# Patient Record
Sex: Male | Born: 1980 | Race: Black or African American | Hispanic: No | Marital: Single
Health system: Southern US, Community
[De-identification: ages and names within clinical notes are randomized; demographics above are authoritative.]

---

## 2021-01-17 ENCOUNTER — Encounter (HOSPITAL_COMMUNITY): Payer: Self-pay | Admitting: Pharmacy Technician

## 2021-01-17 ENCOUNTER — Other Ambulatory Visit: Payer: Self-pay

## 2021-01-17 ENCOUNTER — Emergency Department (HOSPITAL_COMMUNITY)
Admission: EM | Admit: 2021-01-17 | Discharge: 2021-01-17 | Disposition: A | Discharge status: Home / Self Care | Payer: Self-pay | Attending: Emergency Medicine | Admitting: Emergency Medicine

## 2021-01-17 ENCOUNTER — Emergency Department (HOSPITAL_COMMUNITY): Payer: Self-pay

## 2021-01-17 DIAGNOSIS — R61 Generalized hyperhidrosis: Secondary | ICD-10-CM | POA: Insufficient documentation

## 2021-01-17 DIAGNOSIS — Z5321 Procedure and treatment not carried out due to patient leaving prior to being seen by health care provider: Secondary | ICD-10-CM | POA: Insufficient documentation

## 2021-01-17 DIAGNOSIS — R110 Nausea: Secondary | ICD-10-CM | POA: Insufficient documentation

## 2021-01-17 DIAGNOSIS — R072 Precordial pain: Secondary | ICD-10-CM | POA: Insufficient documentation

## 2021-01-17 DIAGNOSIS — R0602 Shortness of breath: Secondary | ICD-10-CM | POA: Insufficient documentation

## 2021-01-17 LAB — CBC
HCT: 42.9 % (ref 39.0–52.0)
Hemoglobin: 14.1 g/dL (ref 13.0–17.0)
MCH: 32 pg (ref 26.0–34.0)
MCHC: 32.9 g/dL (ref 30.0–36.0)
MCV: 97.3 fL (ref 80.0–100.0)
Platelets: 258 10*3/uL (ref 150–400)
RBC: 4.41 MIL/uL (ref 4.22–5.81)
RDW: 13.3 % (ref 11.5–15.5)
WBC: 11.1 10*3/uL — ABNORMAL HIGH (ref 4.0–10.5)
nRBC: 0 % (ref 0.0–0.2)

## 2021-01-17 LAB — BASIC METABOLIC PANEL
Anion gap: 13 (ref 5–15)
BUN: 7 mg/dL (ref 6–20)
CO2: 20 mmol/L — ABNORMAL LOW (ref 22–32)
Calcium: 9.4 mg/dL (ref 8.9–10.3)
Chloride: 104 mmol/L (ref 98–111)
Creatinine, Ser: 0.88 mg/dL (ref 0.61–1.24)
GFR, Estimated: >60 mL/min (ref >60)
Glucose, Bld: 85 mg/dL (ref 70–99)
Potassium: 3.9 mmol/L (ref 3.5–5.1)
Sodium: 137 mmol/L (ref 135–145)

## 2021-01-17 LAB — TROPONIN I (HIGH SENSITIVITY): Troponin I (High Sensitivity): <2 ng/L (ref <18)

## 2021-01-17 NOTE — ED Triage Notes (Signed)
Pt here with anxiety attack with associated chest pain over the last few weeks. VSS with ems.

## 2021-01-17 NOTE — ED Provider Notes (Signed)
Emergency Medicine Provider Triage Evaluation Note  Joseph Lyons , a 40 y.o. male  was evaluated in triage.  Pt complains of substernal chest pain characterized as heaviness would began this morning.  He states he has been having chest pain intermittently for some time but has not been formally evaluated.  He reports associated shortness of breath, diaphoresis, and nausea.  Chest pain radiates up into the neck bilaterally.  He denies any vomiting, abdominal pain, diarrhea.  Patient drinks daily and uses marijuana daily.  No history of cocaine or sympathomimetic use.  Review of Systems  Positive:  Negative: See above   Physical Exam  BP (!) 139/100 (BP Location: Right Arm)   Pulse 93   Temp 98.2 F (36.8 C) (Oral)   Resp 19   SpO2 100%  Gen:   Awake, no distress   Resp:  Normal effort  MSK:   Moves extremities without difficulty  Other:    Medical Decision Making  Medically screening exam initiated at 6:19 PM.  Appropriate orders placed.  Spike Desilets was informed that the remainder of the evaluation will be completed by another provider, this initial triage assessment does not replace that evaluation, and the importance of remaining in the ED until their evaluation is complete.     Honor Loh [place], PA-C 01/17/21 1820    Benjiman Core, MD 01/17/21 2227

## 2021-01-17 NOTE — ED Notes (Signed)
Pt stated that he needed to leave because it was getting late and his child was home alone. Pt seen leaving ED.

## 2023-01-22 IMAGING — DX DG CHEST 2V
2 series · 2 of 2 positions shown · non-contrast
Comparison: None.

CLINICAL DATA: Chest pain

EXAM:
CHEST - 2 VIEW

[w chest pa]
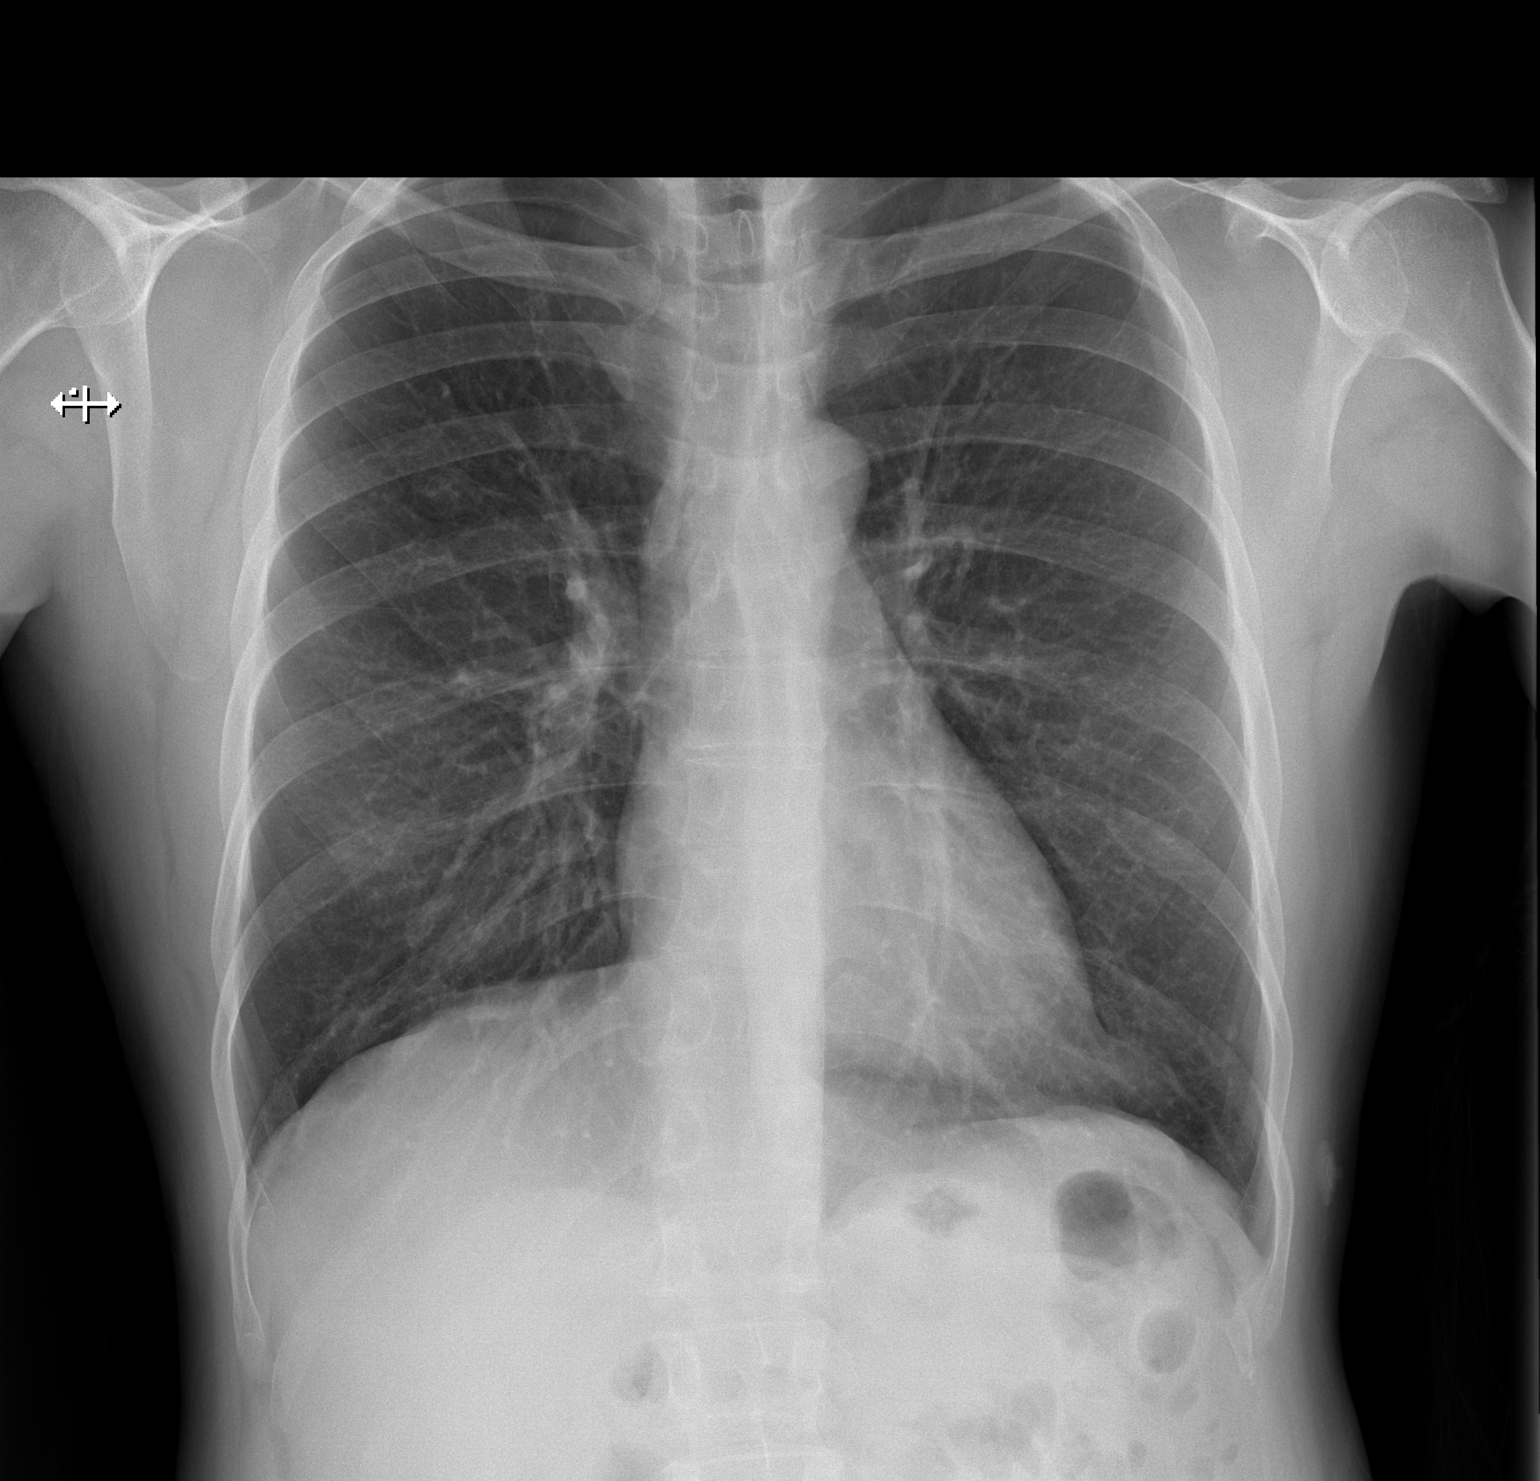

[w chest lat]
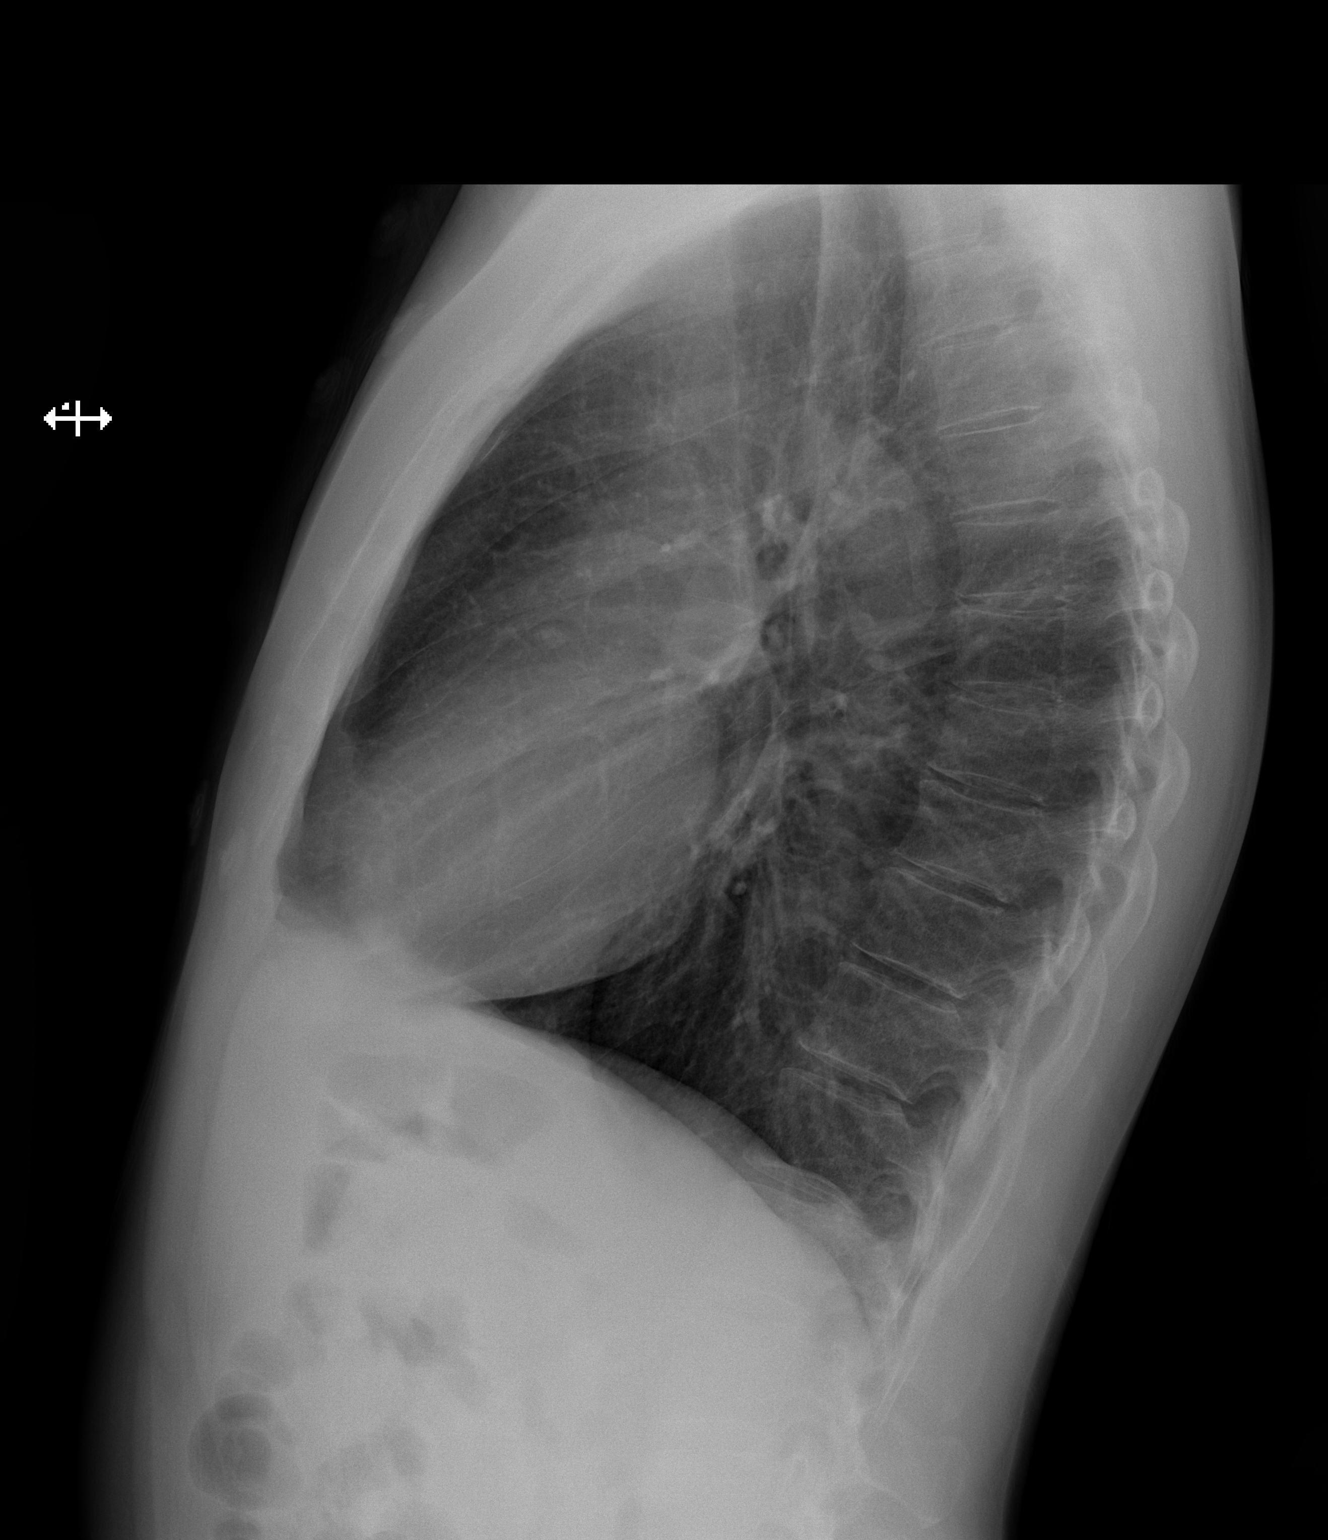

[2 of 2 positions shown; findings below may reference images not displayed]

FINDINGS: Cardiac and mediastinal contours are within normal limits. No focal
pulmonary opacity. No pleural effusion or pneumothorax. No acute
osseous abnormality.
IMPRESSION: No acute cardiopulmonary process.
